# Patient Record
Sex: Female | Born: 2000 | Race: White | Hispanic: No | Marital: Single | Smoking: Current every day smoker
Health system: Southern US, Community
[De-identification: ages and names within clinical notes are randomized; demographics above are authoritative.]

## PROBLEM LIST (undated history)

## (undated) DIAGNOSIS — A6000 Herpesviral infection of urogenital system, unspecified: Secondary | ICD-10-CM

## (undated) DIAGNOSIS — E282 Polycystic ovarian syndrome: Secondary | ICD-10-CM

## (undated) DIAGNOSIS — D569 Thalassemia, unspecified: Secondary | ICD-10-CM

## (undated) HISTORY — PX: DENTAL SURGERY: SHX609

---

## 2019-01-31 ENCOUNTER — Emergency Department (HOSPITAL_COMMUNITY): Payer: PRIVATE HEALTH INSURANCE

## 2019-01-31 ENCOUNTER — Emergency Department (HOSPITAL_COMMUNITY)
Admission: EM | Admit: 2019-01-31 | Discharge: 2019-01-31 | Disposition: A | Discharge status: Home / Self Care | Payer: PRIVATE HEALTH INSURANCE | Attending: Emergency Medicine | Admitting: Emergency Medicine

## 2019-01-31 ENCOUNTER — Other Ambulatory Visit: Payer: Self-pay

## 2019-01-31 ENCOUNTER — Encounter (HOSPITAL_COMMUNITY): Payer: Self-pay | Admitting: Emergency Medicine

## 2019-01-31 DIAGNOSIS — F17210 Nicotine dependence, cigarettes, uncomplicated: Secondary | ICD-10-CM | POA: Insufficient documentation

## 2019-01-31 DIAGNOSIS — Y93I9 Activity, other involving external motion: Secondary | ICD-10-CM | POA: Insufficient documentation

## 2019-01-31 DIAGNOSIS — Y998 Other external cause status: Secondary | ICD-10-CM | POA: Diagnosis not present

## 2019-01-31 DIAGNOSIS — R51 Headache: Secondary | ICD-10-CM | POA: Diagnosis present

## 2019-01-31 DIAGNOSIS — S20212A Contusion of left front wall of thorax, initial encounter: Secondary | ICD-10-CM | POA: Diagnosis not present

## 2019-01-31 DIAGNOSIS — R0789 Other chest pain: Secondary | ICD-10-CM

## 2019-01-31 DIAGNOSIS — Y92410 Unspecified street and highway as the place of occurrence of the external cause: Secondary | ICD-10-CM | POA: Diagnosis not present

## 2019-01-31 HISTORY — DX: Thalassemia, unspecified: D56.9

## 2019-01-31 HISTORY — DX: Polycystic ovarian syndrome: E28.2

## 2019-01-31 HISTORY — DX: Herpesviral infection of urogenital system, unspecified: A60.00

## 2019-01-31 LAB — BASIC METABOLIC PANEL WITH GFR
Anion gap: 9 (ref 5–15)
BUN: 11 mg/dL (ref 6–20)
CO2: 24 mmol/L (ref 22–32)
Calcium: 9.4 mg/dL (ref 8.9–10.3)
Chloride: 104 mmol/L (ref 98–111)
Creatinine, Ser: 0.63 mg/dL (ref 0.44–1.00)
GFR calc Af Amer: >60 mL/min
GFR calc non Af Amer: >60 mL/min
Glucose, Bld: 91 mg/dL (ref 70–99)
Potassium: 3.6 mmol/L (ref 3.5–5.1)
Sodium: 137 mmol/L (ref 135–145)

## 2019-01-31 LAB — CBC WITH DIFFERENTIAL/PLATELET
Abs Immature Granulocytes: 0.05 10*3/uL (ref 0.00–0.07)
Basophils Absolute: 0.1 10*3/uL (ref 0.0–0.1)
Basophils Relative: 0 %
Eosinophils Absolute: 0 10*3/uL (ref 0.0–0.5)
Eosinophils Relative: 0 %
HCT: 36.2 % (ref 36.0–46.0)
Hemoglobin: 11.1 g/dL — ABNORMAL LOW (ref 12.0–15.0)
Immature Granulocytes: 0 %
Lymphocytes Relative: 16 %
Lymphs Abs: 2.5 10*3/uL (ref 0.7–4.0)
MCH: 20.5 pg — ABNORMAL LOW (ref 26.0–34.0)
MCHC: 30.7 g/dL (ref 30.0–36.0)
MCV: 66.9 fL — ABNORMAL LOW (ref 80.0–100.0)
Monocytes Absolute: 0.8 10*3/uL (ref 0.1–1.0)
Monocytes Relative: 5 %
Neutro Abs: 12.1 10*3/uL — ABNORMAL HIGH (ref 1.7–7.7)
Neutrophils Relative %: 79 %
Platelets: 200 10*3/uL (ref 150–400)
RBC: 5.41 MIL/uL — ABNORMAL HIGH (ref 3.87–5.11)
RDW: 15.9 % — ABNORMAL HIGH (ref 11.5–15.5)
WBC: 15.6 10*3/uL — ABNORMAL HIGH (ref 4.0–10.5)
nRBC: 0 % (ref 0.0–0.2)

## 2019-01-31 LAB — URINALYSIS, ROUTINE W REFLEX MICROSCOPIC
Bilirubin Urine: NEGATIVE
Glucose, UA: NEGATIVE mg/dL
Hgb urine dipstick: NEGATIVE
Ketones, ur: 20 mg/dL — AB
Leukocytes,Ua: NEGATIVE
Nitrite: NEGATIVE
Protein, ur: NEGATIVE mg/dL
Specific Gravity, Urine: 1.017 (ref 1.005–1.030)
pH: 7 (ref 5.0–8.0)

## 2019-01-31 LAB — I-STAT BETA HCG BLOOD, ED (MC, WL, AP ONLY): I-stat hCG, quantitative: <5 m[IU]/mL (ref <5)

## 2019-01-31 MED ORDER — HYDROCODONE-ACETAMINOPHEN 5-325 MG PO TABS
1.0000 | ORAL_TABLET | Freq: Once | ORAL | Status: AC
  Administered 2019-01-31: 1 via ORAL
  Filled 2019-01-31: qty 1
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
